# Patient Record
Sex: Male | Born: 1980 | Race: White | Hispanic: No | Marital: Married | Smoking: Never smoker
Health system: Southern US, Community
[De-identification: ages and names within clinical notes are randomized; demographics above are authoritative.]

## PROBLEM LIST (undated history)

## (undated) HISTORY — PX: TYMPANOSTOMY TUBE PLACEMENT: SHX32

## (undated) HISTORY — PX: TONSILLECTOMY: SUR1361

---

## 2001-05-07 ENCOUNTER — Emergency Department (HOSPITAL_COMMUNITY): Admission: EM | Admit: 2001-05-07 | Discharge: 2001-05-07 | Payer: Self-pay | Admitting: Emergency Medicine

## 2010-01-11 ENCOUNTER — Encounter: Admission: RE | Admit: 2010-01-11 | Discharge: 2010-01-11 | Payer: Self-pay | Admitting: Internal Medicine

## 2014-12-31 IMAGING — CR DG FOOT COMPLETE 3+V*L*
3 series · 3 of 3 positions shown · non-contrast
Comparison: None

CLINICAL DATA: Left foot injury.

EXAM:
LEFT FOOT - COMPLETE 3+ VIEW

[AP]
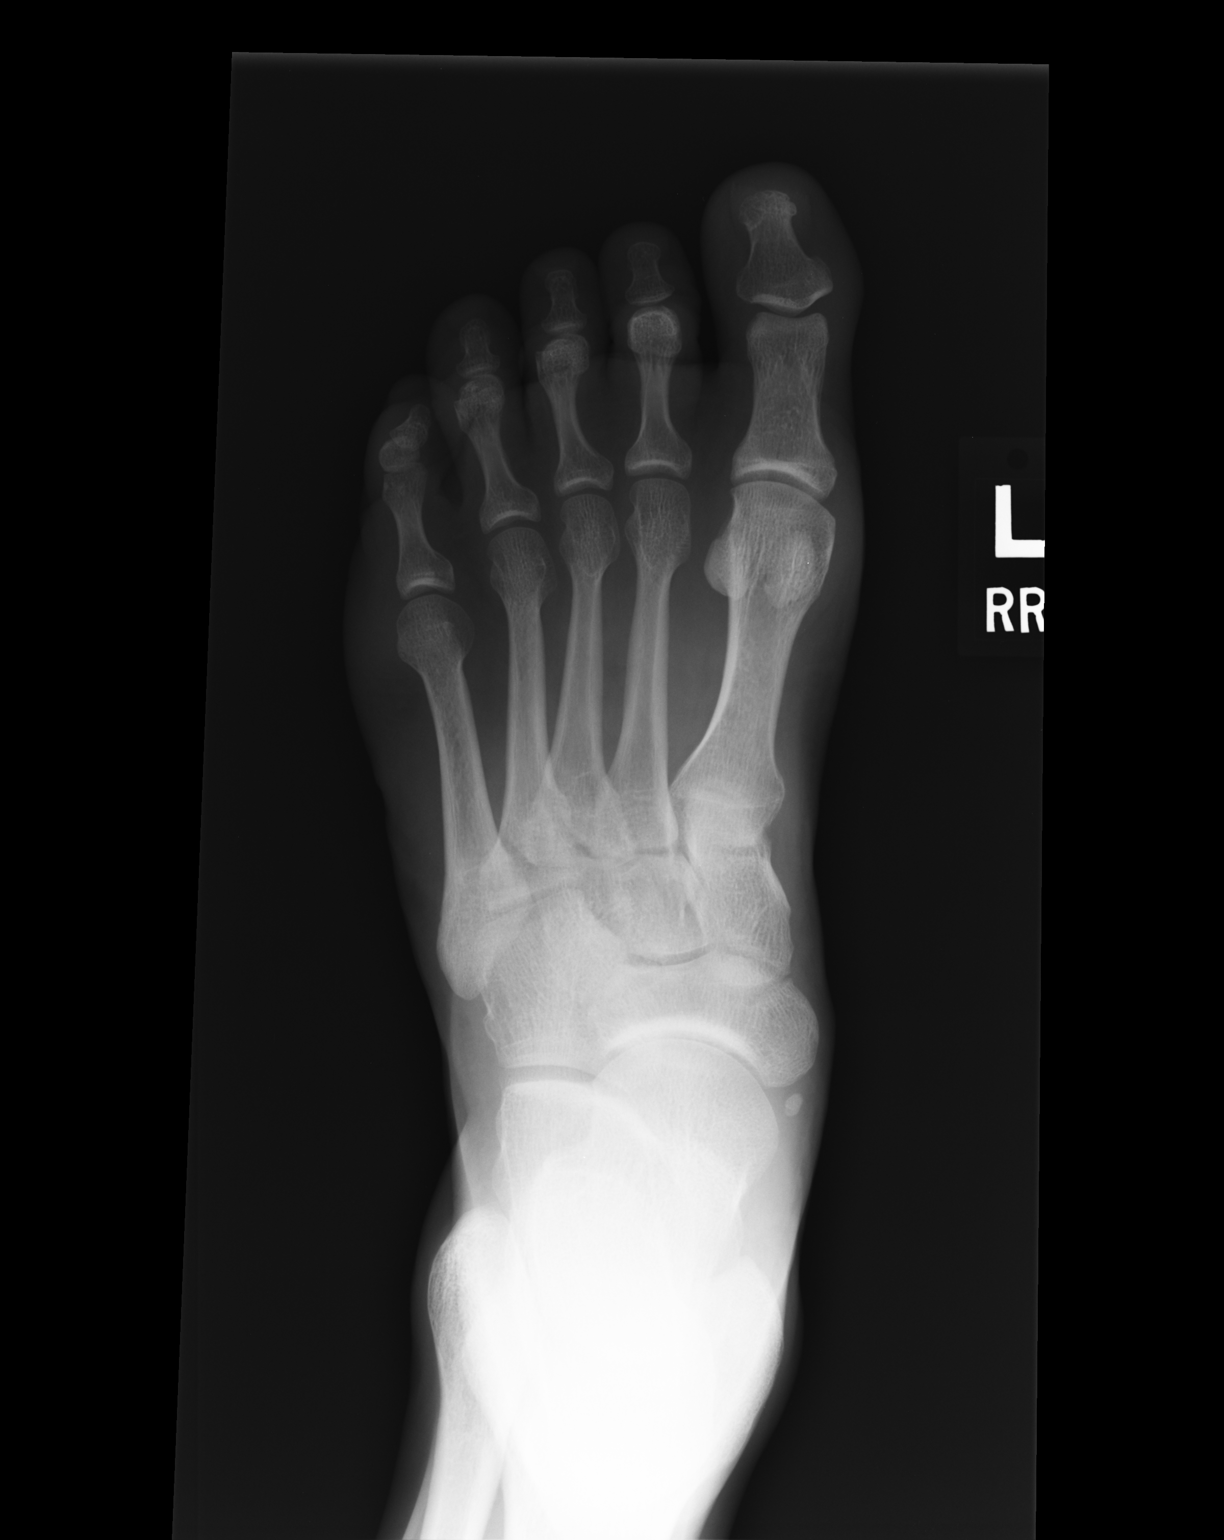

[ap obl int rot]
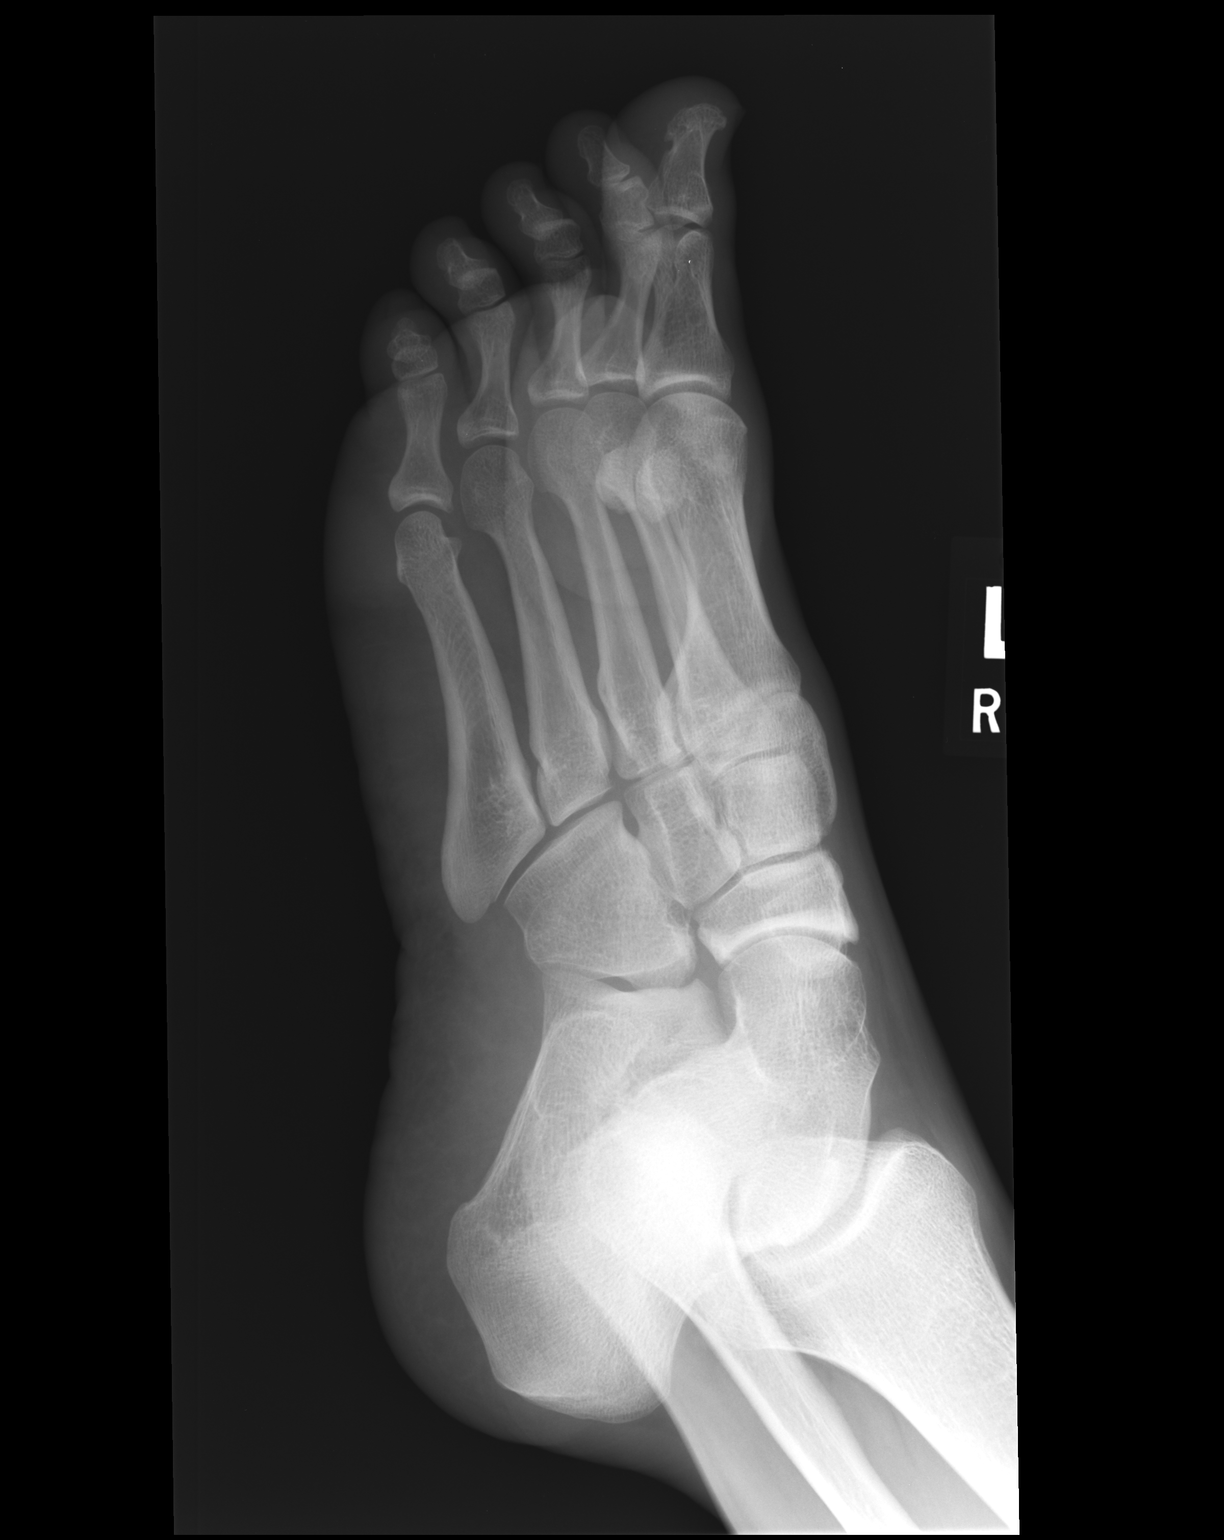

[lateral]
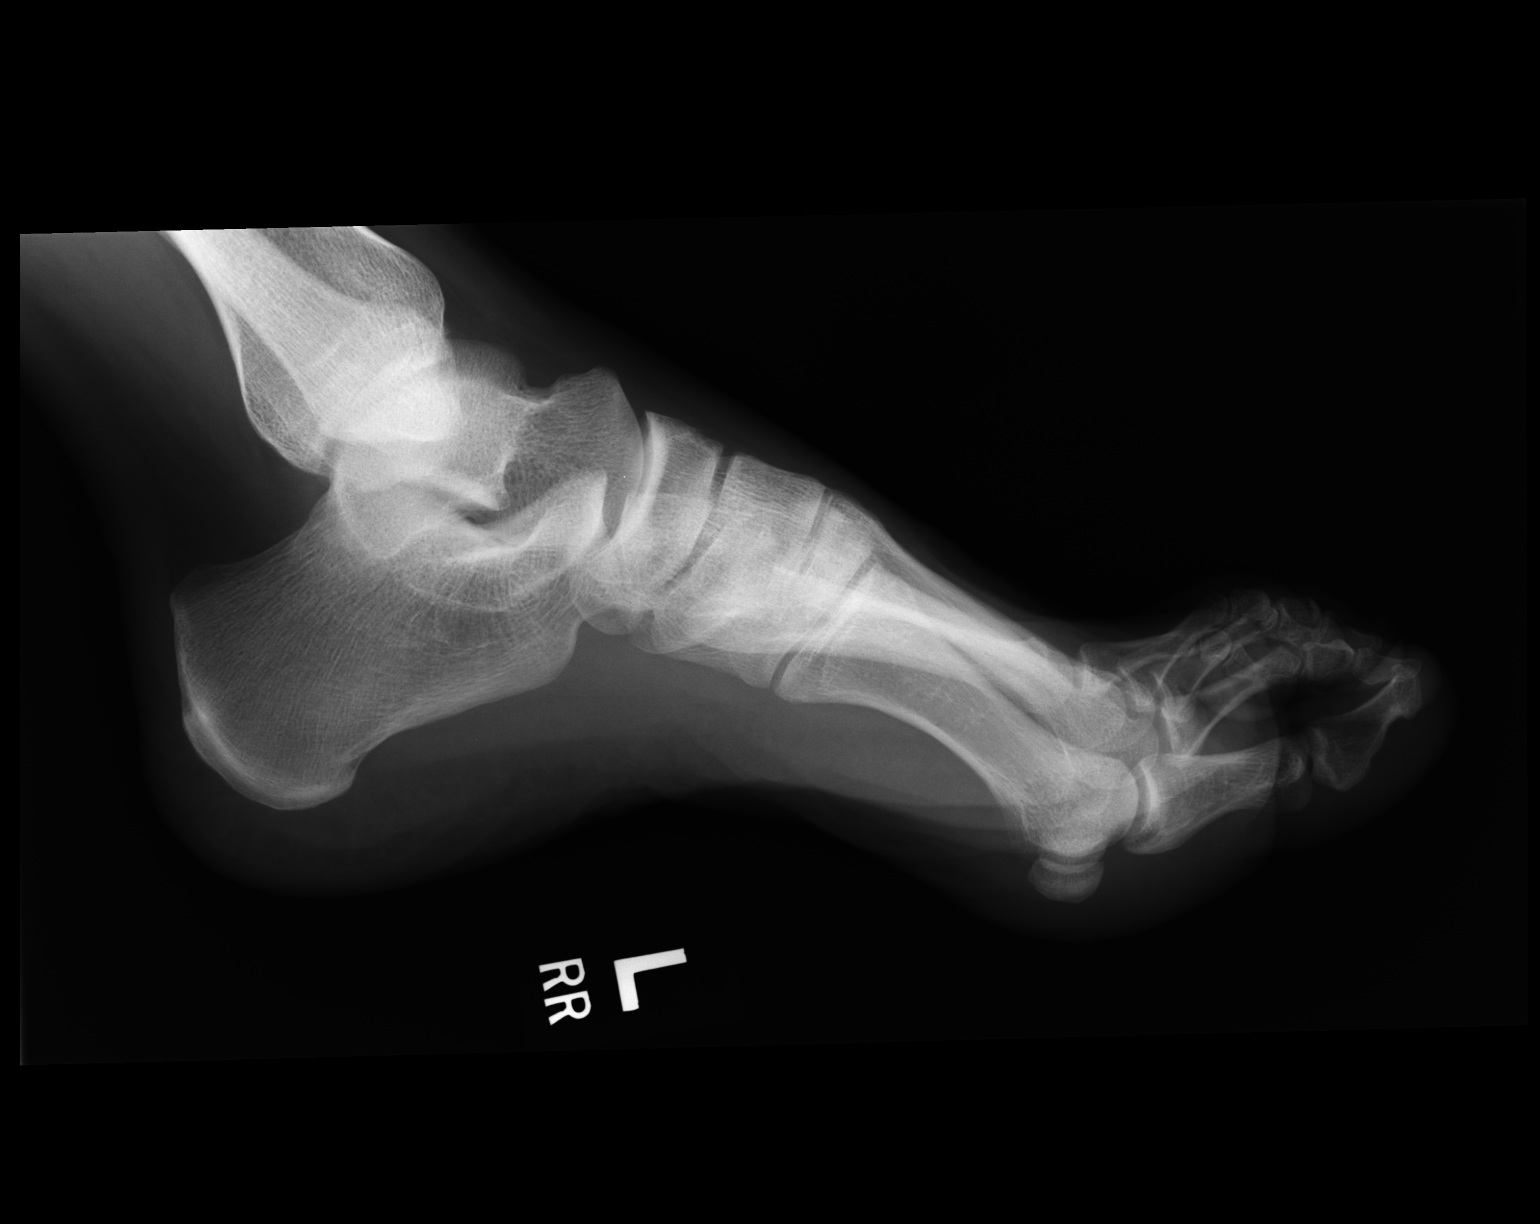

[3 of 3 positions shown; findings below may reference images not displayed]

FINDINGS: There is no evidence of fracture or dislocation. There is no
evidence of arthropathy or other focal bone abnormality. Soft
tissues are unremarkable.
IMPRESSION: Negative.

## 2020-01-26 ENCOUNTER — Other Ambulatory Visit: Payer: Self-pay

## 2020-01-26 ENCOUNTER — Ambulatory Visit: Payer: Self-pay | Attending: Internal Medicine

## 2020-01-26 DIAGNOSIS — Z20822 Contact with and (suspected) exposure to covid-19: Secondary | ICD-10-CM | POA: Insufficient documentation

## 2020-01-27 LAB — NOVEL CORONAVIRUS, NAA: SARS-CoV-2, NAA: NOT DETECTED

## 2020-02-21 ENCOUNTER — Ambulatory Visit: Payer: Self-pay | Attending: Internal Medicine

## 2020-02-21 DIAGNOSIS — Z23 Encounter for immunization: Secondary | ICD-10-CM | POA: Insufficient documentation

## 2020-02-21 NOTE — Progress Notes (Signed)
   Covid-19 Vaccination Clinic  Name:  Logan Cantu    MRN: [id number] DOB: [birth year]  02/21/2020  [name] was observed post Covid-19 immunization for 15 minutes without incident. He was provided with Vaccine Information Sheet and instruction to access the V-Safe system.   [name] was instructed to call 911 with any severe reactions post vaccine: Marland Kitchen Difficulty breathing  . Swelling of face and throat  . A fast heartbeat  . A bad rash all over body  . Dizziness and weakness   Immunizations Administered    Name Date Dose VIS Date Route   Pfizer COVID-19 Vaccine 02/21/2020  3:35 PM 0.3 mL 11/28/2019 Intramuscular   Manufacturer: [employer], [employer]   Lot: [id number]   NDC: [address]-7

## 2020-03-13 ENCOUNTER — Ambulatory Visit: Payer: Self-pay | Attending: Internal Medicine

## 2020-03-13 DIAGNOSIS — Z23 Encounter for immunization: Secondary | ICD-10-CM

## 2020-03-13 NOTE — Progress Notes (Signed)
   Covid-19 Vaccination Clinic  Name:  Logan Cantu    MRN: [id number] DOB: [birth year]  03/13/2020  Mr. Logan Cantu was observed post Covid-19 immunization for 15 minutes without incident. He was provided with Vaccine Information Sheet and instruction to access the V-Safe system.   Mr. Logan Cantu was instructed to call 911 with any severe reactions post vaccine: Marland Kitchen Difficulty breathing  . Swelling of face and throat  . A fast heartbeat  . A bad rash all over body  . Dizziness and weakness   Immunizations Administered    Name Date Dose VIS Date Route   Pfizer COVID-19 Vaccine 03/13/2020  1:39 PM 0.3 mL 11/28/2019 Intramuscular   Manufacturer: [employer], [employer]   Lot: [id number]   NDC: [address]-9

## 2020-03-24 ENCOUNTER — Ambulatory Visit: Payer: Self-pay

## 2022-01-04 ENCOUNTER — Emergency Department: Admit: 2022-01-04 | Discharge status: Absent without leave | Payer: Self-pay

## 2022-01-04 ENCOUNTER — Emergency Department (INDEPENDENT_AMBULATORY_CARE_PROVIDER_SITE_OTHER)
Admission: EM | Admit: 2022-01-04 | Discharge: 2022-01-04 | Disposition: A | Discharge status: Home / Self Care | Payer: No Typology Code available for payment source | Source: Home / Self Care

## 2022-01-04 ENCOUNTER — Other Ambulatory Visit: Payer: Self-pay

## 2022-01-04 ENCOUNTER — Encounter: Payer: Self-pay | Admitting: Emergency Medicine

## 2022-01-04 DIAGNOSIS — J0100 Acute maxillary sinusitis, unspecified: Secondary | ICD-10-CM

## 2022-01-04 LAB — POC SARS CORONAVIRUS 2 AG -  ED: SARS Coronavirus 2 Ag: NEGATIVE

## 2022-01-04 MED ORDER — AMOXICILLIN-POT CLAVULANATE 875-125 MG PO TABS: 1.0000 | ORAL_TABLET | Freq: Two times a day (BID) | ORAL | 0 refills | Status: DC

## 2022-01-04 NOTE — ED Provider Notes (Signed)
[facility]    CSN: [id number] Arrival date & time: 01/04/22  0908      History   Chief Complaint Chief Complaint  Patient presents with   Nasal Congestion    HPI Logan Cantu is a 41 y.o. male.   HPI 41 year old male presents with runny nose, nasal congestion, and fatigue for 4-5 days.  Patient reports he is vaccinated for COVID-19 and influenza and will need a work note today.  History reviewed. No pertinent past medical history.  There are no problems to display for this patient.   Past Surgical History:  Procedure Laterality Date   TONSILLECTOMY     TYMPANOSTOMY TUBE PLACEMENT Bilateral        Home Medications    Prior to Admission medications   Medication Sig Start Date End Date Taking? Authorizing Provider  amoxicillin-clavulanate (AUGMENTIN) 875-125 MG tablet Take 1 tablet by mouth every 12 (twelve) hours. 01/04/22  Yes Trevor Iha, FNP    Family History Family History  Problem Relation Age of Onset   Healthy Mother    Heart failure Father    Heart attack Father     Social History Social History   Tobacco Use   Smoking status: Never    Passive exposure: Never   Smokeless tobacco: Never  Vaping Use   Vaping Use: Never used  Substance Use Topics   Alcohol use: Not Currently   Drug use: Never     Allergies   Patient has no known allergies.   Review of Systems Review of Systems  Constitutional:  Positive for fatigue.  HENT:  Positive for congestion, rhinorrhea and sinus pressure.   All other systems reviewed and are negative.   Physical Exam Triage Vital Signs ED Triage Vitals  Enc Vitals Group     BP 01/04/22 0934 (!) 143/89     Pulse Rate 01/04/22 0934 84     Resp 01/04/22 0934 15     Temp 01/04/22 0934 99.2 F (37.3 C)     Temp Source 01/04/22 0934 Oral     SpO2 01/04/22 0934 99 %     Weight 01/04/22 0936 175 lb (79.4 kg)     Height 01/04/22 0936 5' 8.5" (1.74 m)     Head Circumference --      Peak Flow  --      Pain Score 01/04/22 0936 2     Pain Loc --      Pain Edu? --      Excl. in GC? --    No data found.  Updated Vital Signs BP (!) 143/89 (BP Location: Left Arm)    Pulse 84    Temp 99.2 F (37.3 C) (Oral)    Resp 15    Ht 5' 8.5" (1.74 m)    Wt 175 lb (79.4 kg)    SpO2 99%    BMI 26.22 kg/m       Physical Exam Vitals and nursing note reviewed.  Constitutional:      General: He is not in acute distress.    Appearance: Normal appearance. He is obese. He is not ill-appearing.  HENT:     Right Ear: Tympanic membrane and external ear normal.     Left Ear: Tympanic membrane and external ear normal.     Ears:     Comments: Moderate to significant eustachian tube dysfunction noted bilaterally    Mouth/Throat:     Mouth: Mucous membranes are moist.     Pharynx: Oropharynx is  clear.  Eyes:     Extraocular Movements: Extraocular movements intact.     Conjunctiva/sclera: Conjunctivae normal.     Pupils: Pupils are equal, round, and reactive to light.  Cardiovascular:     Rate and Rhythm: Normal rate and regular rhythm.     Pulses: Normal pulses.     Heart sounds: Normal heart sounds.  Pulmonary:     Effort: Pulmonary effort is normal.     Breath sounds: Normal breath sounds.  Musculoskeletal:     Cervical back: Normal range of motion and neck supple.  Skin:    General: Skin is warm and dry.  Neurological:     General: No focal deficit present.     Mental Status: He is alert and oriented to person, place, and time.     UC Treatments / Results  Labs (all labs ordered are listed, but only abnormal results are displayed) Labs Reviewed  POC SARS CORONAVIRUS 2 AG -  ED    EKG   Radiology No results found.  Procedures Procedures (including critical care time)  Medications Ordered in UC Medications - No data to display  Initial Impression / Assessment and Plan / UC Course  I have reviewed the triage vital signs and the nursing notes.  Pertinent labs & imaging  results that were available during my care of the patient were reviewed by me and considered in my medical decision making (see chart for details).     MDM: 1. Acute maxillary sinusitis, recurrence not specified-Rx'd Augmentin. Advised patient take medication as directed with food to completion.  Encouraged patient increase daily water intake while taking this medication.  Work note provided per patient request.  Patient discharged home, hemodynamically stable. Final Clinical Impressions(s) / UC Diagnoses   Final diagnoses:  Acute maxillary sinusitis, recurrence not specified     Discharge Instructions      Advised patient take medication as directed with food to completion.  Encourage patient increase daily water intake while taking this medication.     ED Prescriptions     Medication Sig Dispense Auth. Provider   amoxicillin-clavulanate (AUGMENTIN) 875-125 MG tablet Take 1 tablet by mouth every 12 (twelve) hours. 14 tablet Trevor Iha, FNP      PDMP not reviewed this encounter.   Trevor Iha, FNP 01/04/22 1017

## 2022-01-04 NOTE — Discharge Instructions (Addendum)
Advised patient COVID-19 was negative this morning.  Advised patient take medication as directed with food to completion.  Encouraged patient increase daily water intake while taking this medication.

## 2022-01-04 NOTE — ED Triage Notes (Addendum)
Runny nose on Friday night  Nasal congestion since Saturday  Fatigue  Denies fever  OTC  Advil Sinus at 0630 COVID vaccine + booster  Flu vaccine  COVID spring 2022 Will need a work note

## 2023-03-12 DIAGNOSIS — M25531 Pain in right wrist: Secondary | ICD-10-CM | POA: Diagnosis not present

## 2023-05-30 DIAGNOSIS — E7800 Pure hypercholesterolemia, unspecified: Secondary | ICD-10-CM | POA: Diagnosis not present

## 2023-06-18 ENCOUNTER — Ambulatory Visit
Admission: EM | Admit: 2023-06-18 | Discharge: 2023-06-18 | Disposition: A | Discharge status: Home / Self Care | Payer: 59

## 2023-06-18 DIAGNOSIS — M546 Pain in thoracic spine: Secondary | ICD-10-CM

## 2023-06-18 MED ORDER — CYCLOBENZAPRINE HCL 10 MG PO TABS: 10.0000 mg | ORAL_TABLET | Freq: Two times a day (BID) | ORAL | 0 refills | Status: AC | PRN

## 2023-06-18 MED ORDER — KETOROLAC TROMETHAMINE 30 MG/ML IJ SOLN
30.0000 mg | Freq: Once | INTRAMUSCULAR | Status: AC
  Administered 2023-06-18: 30 mg via INTRAMUSCULAR

## 2023-06-18 NOTE — ED Provider Notes (Signed)
[facility]    CSN: [id number] Arrival date & time: 06/18/23  0825      History   Chief Complaint No chief complaint on file.   HPI Logan Cantu is a 42 y.o. male.   Patient presents today with 2-day history of middle back pain that began while sleeping on low quality mattresses while at the beach.  He denies any recent fall, accident, trauma, or known injury to the back.  No recent pulling or pushing anything heavy.  Reports he slept on the mattress Saturday night and woke up Sunday morning with pain in his back and could barely stand straight.  He denies new numbness or tingling in between his legs, new urinary incontinence, numbness or tingling in his hands or feet, weakness or sensation of giving way of bilateral lower extremities.  No urinary symptoms today.  Has taken Motrin which seem to help temporarily.  Has also been applying ice/heat with mild improvement.  Denies history of back surgeries.    History reviewed. No pertinent past medical history.  There are no problems to display for this patient.   Past Surgical History:  Procedure Laterality Date   TONSILLECTOMY     TYMPANOSTOMY TUBE PLACEMENT Bilateral        Home Medications    Prior to Admission medications   Medication Sig Start Date End Date Taking? Authorizing Provider  atorvastatin (LIPITOR) 40 MG tablet Take 40 mg by mouth daily. 05/30/23  Yes [provider]  cyclobenzaprine (FLEXERIL) 10 MG tablet Take 1 tablet (10 mg total) by mouth 2 (two) times daily as needed for muscle spasms. Do not take with alcohol or while driving or operating heavy machinery.  May cause drowsiness. 06/18/23  Yes Valentino Nose, NP    Family History Family History  Problem Relation Age of Onset   Healthy Mother    Heart failure Father    Heart attack Father     Social History Social History   Tobacco Use   Smoking status: Never    Passive exposure: Never   Smokeless tobacco: Never   Vaping Use   Vaping Use: Never used  Substance Use Topics   Alcohol use: Not Currently   Drug use: Never     Allergies   Patient has no known allergies.   Review of Systems Review of Systems Per HPI  Physical Exam Triage Vital Signs ED Triage Vitals  Enc Vitals Group     BP 06/18/23 0834 135/82     Pulse Rate 06/18/23 0834 73     Resp 06/18/23 0834 16     Temp 06/18/23 0834 98 F (36.7 C)     Temp Source 06/18/23 0834 Oral     SpO2 06/18/23 0834 96 %     Weight --      Height --      Head Circumference --      Peak Flow --      Pain Score 06/18/23 0838 5     Pain Loc --      Pain Edu? --      Excl. in GC? --    No data found.  Updated Vital Signs BP 135/82 (BP Location: Right Arm)   Pulse 73   Temp 98 F (36.7 C) (Oral)   Resp 16   SpO2 96%   Visual Acuity Right Eye Distance:   Left Eye Distance:   Bilateral Distance:    Right Eye Near:   Left Eye Near:  Bilateral Near:     Physical Exam Vitals and nursing note reviewed.  Constitutional:      General: He is not in acute distress.    Appearance: Normal appearance. He is not toxic-appearing.  HENT:     Head: Normocephalic and atraumatic.     Mouth/Throat:     Mouth: Mucous membranes are moist.     Pharynx: Oropharynx is clear.  Pulmonary:     Effort: Pulmonary effort is normal. No respiratory distress.  Abdominal:     Tenderness: There is no right CVA tenderness or left CVA tenderness.  Musculoskeletal:       Back:     Right lower leg: No edema.     Left lower leg: No edema.     Comments: Inspection: no swelling, bruising, obvious deformity or redness Palpation: tender to palpation in approximately area marked; no obvious deformities palpated ROM: Full ROM to bilateral upper and lower extremities Strength: 5/5 bilateral upper and lower extremities Neurovascular: neurovascularly intact in bilateral upper and lower extremities  Skin:    General: Skin is warm and dry.     Capillary  Refill: Capillary refill takes less than 2 seconds.     Coloration: Skin is not jaundiced or pale.     Findings: No erythema.  Neurological:     Mental Status: He is alert and oriented to person, place, and time.     Motor: No weakness.     Gait: Gait normal.  Psychiatric:        Behavior: Behavior is cooperative.      UC Treatments / Results  Labs (all labs ordered are listed, but only abnormal results are displayed) Labs Reviewed - No data to display  EKG   Radiology No results found.  Procedures Procedures (including critical care time)  Medications Ordered in UC Medications  ketorolac (TORADOL) 30 MG/ML injection 30 mg (30 mg Intramuscular Given 06/18/23 0852)    Initial Impression / Assessment and Plan / UC Course  I have reviewed the triage vital signs and the nursing notes.  Pertinent labs & imaging results that were available during my care of the patient were reviewed by me and considered in my medical decision making (see chart for details).   Patient is well-appearing, normotensive, afebrile, not tachycardic, not tachypneic, oxygenating well on room air.    1. Acute midline thoracic back pain Treat with Toradol 30 mg IM today in urgent care Avoid NSAIDs for the next 48 hours, start oxygen Tylenol every 6 hours as needed for pain and muscle relaxant medicine Back stretches given Other supportive care discussed Strict ER precautions also discussed  The patient was given the opportunity to ask questions.  All questions answered to their satisfaction.  The patient is in agreement to this plan.    Final Clinical Impressions(s) / UC Diagnoses   Final diagnoses:  Acute midline thoracic back pain     Discharge Instructions      We have given you a shot of Toradol today to help with the pain in your back.  Please do not take any other NSAIDs (Motrin, Aleve, naproxen, Advil, ibuprofen) for the next 48 hours.  If anything for pain today, recommend Tylenol 500 to  1000 mg every 6 hours as needed. In addition, start taking the muscle relaxant up to twice daily as needed for muscular pain.  Do not take this prior to operating or driving heavy machinery as it may make you drowsy. Start the back exercises and continue  ice/heat 15 minutes on, 45 minutes off every hour while awake.    ED Prescriptions     Medication Sig Dispense Auth. Provider   cyclobenzaprine (FLEXERIL) 10 MG tablet Take 1 tablet (10 mg total) by mouth 2 (two) times daily as needed for muscle spasms. Do not take with alcohol or while driving or operating heavy machinery.  May cause drowsiness. 20 tablet Valentino Nose, NP      PDMP not reviewed this encounter.   Valentino Nose, NP 06/18/23 [phone]

## 2023-06-18 NOTE — ED Triage Notes (Signed)
Pt c/o of back pain across mid back pt denies injury. Pt states he was at the beach and slept  on crappy beds and then a few days later started spasming in the back area

## 2023-06-18 NOTE — Discharge Instructions (Addendum)
We have given you a shot of Toradol today to help with the pain in your back.  Please do not take any other NSAIDs (Motrin, Aleve, naproxen, Advil, ibuprofen) for the next 48 hours.  If anything for pain today, recommend Tylenol 500 to 1000 mg every 6 hours as needed. In addition, start taking the muscle relaxant up to twice daily as needed for muscular pain.  Do not take this prior to operating or driving heavy machinery as it may make you drowsy. Start the back exercises and continue ice/heat 15 minutes on, 45 minutes off every hour while awake.

## 2023-12-04 DIAGNOSIS — E7800 Pure hypercholesterolemia, unspecified: Secondary | ICD-10-CM | POA: Diagnosis not present
